# Patient Record
Sex: Male | Born: 1980 | Race: White | Hispanic: No | Marital: Single | Smoking: Never smoker
Health system: Southern US, Community
[De-identification: ages and names within clinical notes are randomized; demographics above are authoritative.]

## PROBLEM LIST (undated history)

## (undated) DIAGNOSIS — R51 Headache: Principal | ICD-10-CM

## (undated) DIAGNOSIS — F329 Major depressive disorder, single episode, unspecified: Secondary | ICD-10-CM

## (undated) DIAGNOSIS — T7840XA Allergy, unspecified, initial encounter: Secondary | ICD-10-CM

## (undated) DIAGNOSIS — IMO0001 Reserved for inherently not codable concepts without codable children: Secondary | ICD-10-CM

## (undated) DIAGNOSIS — F32A Depression, unspecified: Secondary | ICD-10-CM

## (undated) DIAGNOSIS — G479 Sleep disorder, unspecified: Secondary | ICD-10-CM

## (undated) DIAGNOSIS — F419 Anxiety disorder, unspecified: Secondary | ICD-10-CM

## (undated) DIAGNOSIS — G43909 Migraine, unspecified, not intractable, without status migrainosus: Secondary | ICD-10-CM

## (undated) DIAGNOSIS — K219 Gastro-esophageal reflux disease without esophagitis: Secondary | ICD-10-CM

## (undated) HISTORY — DX: Major depressive disorder, single episode, unspecified: F32.9

## (undated) HISTORY — DX: Sleep disorder, unspecified: G47.9

## (undated) HISTORY — DX: Depression, unspecified: F32.A

## (undated) HISTORY — DX: Migraine, unspecified, not intractable, without status migrainosus: G43.909

## (undated) HISTORY — DX: Gastro-esophageal reflux disease without esophagitis: K21.9

## (undated) HISTORY — DX: Anxiety disorder, unspecified: F41.9

## (undated) HISTORY — DX: Reserved for inherently not codable concepts without codable children: IMO0001

## (undated) HISTORY — DX: Headache: R51

## (undated) HISTORY — DX: Allergy, unspecified, initial encounter: T78.40XA

---
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
## 2013-09-03 ENCOUNTER — Ambulatory Visit: Payer: Self-pay | Admitting: Family Medicine

## 2013-09-03 VITALS — BP 132/78 | HR 59 | Temp 98.7°F | Resp 18 | Ht 73.25 in | Wt 306.4 lb

## 2013-09-03 DIAGNOSIS — R51 Headache: Secondary | ICD-10-CM

## 2013-09-03 DIAGNOSIS — G988 Other disorders of nervous system: Secondary | ICD-10-CM

## 2013-09-03 DIAGNOSIS — R5381 Other malaise: Secondary | ICD-10-CM

## 2013-09-03 DIAGNOSIS — G479 Sleep disorder, unspecified: Secondary | ICD-10-CM

## 2013-09-03 DIAGNOSIS — R5383 Other fatigue: Secondary | ICD-10-CM

## 2013-09-03 LAB — POCT CBC
Granulocyte percent: 61.5 %G (ref 37–80)
HCT, POC: 48.5 % (ref 43.5–53.7)
HEMOGLOBIN: 15.9 g/dL (ref 14.1–18.1)
Lymph, poc: 2.4 (ref 0.6–3.4)
MCH, POC: 30.9 pg (ref 27–31.2)
MCHC: 32.8 g/dL (ref 31.8–35.4)
MCV: 94.1 fL (ref 80–97)
MID (cbc): 0.5 (ref 0–0.9)
MPV: 8.8 fL (ref 0–99.8)
POC GRANULOCYTE: 4.6 (ref 2–6.9)
POC LYMPH PERCENT: 32.1 %L (ref 10–50)
POC MID %: 6.4 % (ref 0–12)
Platelet Count, POC: 198 10*3/uL (ref 142–424)
RBC: 5.15 M/uL (ref 4.69–6.13)
RDW, POC: 13.7 %
WBC: 7.4 10*3/uL (ref 4.6–10.2)

## 2013-09-03 LAB — POCT SEDIMENTATION RATE: POCT SED RATE: 15 mm/hr (ref 0–22)

## 2013-09-03 LAB — GLUCOSE, POCT (MANUAL RESULT ENTRY): POC GLUCOSE: 71 mg/dL (ref 70–99)
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
## 2013-09-04 LAB — TSH: TSH: 2.082 u[IU]/mL (ref 0.350–4.500)

## 2013-10-01 ENCOUNTER — Ambulatory Visit (INDEPENDENT_AMBULATORY_CARE_PROVIDER_SITE_OTHER): Payer: PRIVATE HEALTH INSURANCE | Admitting: Neurology

## 2013-10-01 ENCOUNTER — Encounter: Payer: Self-pay | Admitting: Neurology

## 2013-10-01 ENCOUNTER — Ambulatory Visit: Payer: Self-pay | Admitting: Neurology

## 2013-10-01 VITALS — BP 144/86 | HR 66 | Temp 99.1°F | Ht 73.0 in | Wt 315.0 lb

## 2013-10-01 DIAGNOSIS — R400 Somnolence: Secondary | ICD-10-CM

## 2013-10-01 DIAGNOSIS — G479 Sleep disorder, unspecified: Secondary | ICD-10-CM

## 2013-10-01 DIAGNOSIS — R51 Headache: Secondary | ICD-10-CM

## 2013-10-01 DIAGNOSIS — R519 Headache, unspecified: Secondary | ICD-10-CM | POA: Insufficient documentation

## 2013-10-01 DIAGNOSIS — R0683 Snoring: Secondary | ICD-10-CM

## 2013-10-01 DIAGNOSIS — R0989 Other specified symptoms and signs involving the circulatory and respiratory systems: Secondary | ICD-10-CM

## 2013-10-01 DIAGNOSIS — E669 Obesity, unspecified: Secondary | ICD-10-CM

## 2013-10-01 DIAGNOSIS — G4710 Hypersomnia, unspecified: Secondary | ICD-10-CM

## 2013-10-01 DIAGNOSIS — G2581 Restless legs syndrome: Secondary | ICD-10-CM

## 2013-10-01 DIAGNOSIS — R0609 Other forms of dyspnea: Secondary | ICD-10-CM

## 2013-10-01 DIAGNOSIS — G4761 Periodic limb movement disorder: Secondary | ICD-10-CM

## 2013-10-01 HISTORY — DX: Headache, unspecified: R51.9

## 2013-10-01 HISTORY — DX: Sleep disorder, unspecified: G47.9

## 2013-10-01 NOTE — Patient Instructions (Addendum)
Please remember, common headache triggers are: sleep deprivation, dehydration, overheating, stress, hypoglycemia or skipping meals and blood sugar fluctuations, excessive pain medications or excessive alcohol use or caffeine withdrawal. Some people have food triggers such as aged cheese, orange juice or chocolate, especially dark chocolate, or MSG (monosodium glutamate). Try to avoid these headache triggers as much possible. It may be helpful to keep a headache diary to figure out what makes your headaches worse or brings them on and what alleviates them. Some people report headache onset after exercise but studies have shown that regular exercise may actually prevent headaches from coming. If you have exercise-induced headaches, please make sure that you drink plenty of fluid before and after exercising and that you do not over do it and do not overheat.  Please remember to try to maintain good sleep hygiene, which means: Keep a regular sleep and wake schedule, try not to exercise or have a meal within 2 hours of your bedtime, try to keep your bedroom conducive for sleep, that is, cool and dark, without light distractors such as an illuminated alarm clock, and refrain from watching TV right before sleep or in the middle of the night and do not keep the TV or radio on during the night. Also, try not to use or play on electronic devices at bedtime, such as your cell phone, tablet PC or laptop. If you like to read at bedtime on an electronic device, try to dim the background light as much as possible. Do not eat in the middle of the night.   I would like to do a sleep study and brain MRI for completion.

## 2013-10-01 NOTE — Progress Notes (Signed)
Subjective:    Patient ID: Cody Long is a 33 y.o. male.  HPI    Cody Foley, MD, PhD [facility] [address] [address] [place], [place] [address]  Dear Dr. Clelia Croft,   I saw your patient, Cody Long, upon your kind request in my neurologic clinic today for initial consultation of his recurrent headaches. The patient is unaccompanied today. As you know, Mr. [name] is a 33 year old right-handed gentleman with an underlying medical history of allergies, anxiety, depression and obesity, who reports, short electric like impulses inside his head, generalized, lasting seconds at a time and intermittent with no pattern, for years. He attributed these to being off of Cymbalta at the time. He is no longer on it. Sometimes this electric pulse goes to the arms, rarely to the legs, but not classic Lhermitte's, as the sensation is not related to head movement, especially bending, but he does have an occasional trigger with head turn. Symptoms seem to get worse with stress, dehydration, sleep deprived. He sleeps poorly and has occasional morning HAs. He has been told that he snores and is unclear if he has apneas. He has some RLS symptoms and twitches in his sleep. He has always had "bad dreams". His older brother, 58, has HTN, and OSA and is on CPAP, his younger brother, 59, has bipolar.  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 He does not nap. He is in bed by 11 PM to MN, and has some trouble falling asleep. He has tried OTC sleep aids, but felt groggy. He has nocturia x 2. He has had tinnitus for years. Never had TIA or stroke symptoms, denying sudden onset of one sided weakness, numbness, tingling, slurring of speech or droopy face, hearing loss, tinnitus, diplopia or visual field cut or monocular loss of vision, and denies recurrent migrainous headaches. Occasionally, he will see a quick flashing of light.  No history of seizure or staring spells.  His Epworth sleepiness score is 18 today. He never wakes up rested. He has to get up around 10.  His Past Medical History Is Significant For: Past Medical History  Diagnosis Date  . Allergy   . Anxiety   . Depression   . Migraine   . Reflux   . Recurrent headache 10/01/2013  . Sleep disturbance 10/01/2013    His Past Surgical History Is Significant For: Past Surgical History  Procedure Laterality Date  . Tonsillectomy and adenoidectomy  1988    His Family History Is Significant For: Family History  Problem Relation Age of Onset  . Diabetes Paternal Uncle   . Stroke Maternal Grandmother   . Hypertension Sister   . Hypertension Brother   . Bipolar disorder Brother   . Depression      Maternal family hx    His Social History Is Significant For: History   Social History  . Marital Status: Single    Spouse Name: N/A    Number of Children: N/A  . Years of Education: N/A   Social History Main Topics  . Smoking status: Never Smoker   . Smokeless tobacco: None  . Alcohol Use: No  . Drug Use: No  . Sexual Activity: None   Other Topics Concern  . None   Social History Narrative  . None    His Allergies Are:  No Known Allergies:   His Current Medications Are:  Outpatient Encounter Prescriptions as of 10/01/2013  Medication Sig  . DULoxetine (  CYMBALTA) 30 MG capsule Take 30 mg by mouth 2 (two) times daily.  Marland Kitchen. esomeprazole (NEXIUM) 20 MG capsule Take 20 mg by mouth daily at 12 noon.  . loratadine (CLARITIN) 10 MG tablet Take 10 mg by mouth daily.  . naproxen (NAPROSYN) 500 MG tablet Take 500 mg by mouth 2 (two) times daily with a meal.  :  Review of Systems:  Out of a complete 14 point review of systems, all are reviewed and negative with the exception of these symptoms as listed below:   Review of Systems  Constitutional: Positive for activity change and fatigue.  HENT: Positive for rhinorrhea and tinnitus.   Eyes: Positive for visual disturbance (blurred  vision).  Respiratory: Negative.   Cardiovascular: Positive for palpitations.  Gastrointestinal: Negative.   Endocrine:       Flushing  Genitourinary: Negative.   Musculoskeletal: Positive for myalgias.  Skin: Negative.   Allergic/Immunologic: Positive for environmental allergies.  Neurological: Positive for dizziness, speech difficulty, weakness, numbness and headaches.       Memory loss  Hematological: Negative.   Psychiatric/Behavioral: Positive for hallucinations, confusion, sleep disturbance (insomnia, e.d.s., restless leg), dysphoric mood and decreased concentration. The patient is nervous/anxious.     Objective:  Neurologic Exam  Physical Exam Physical Examination:   Filed Vitals:   10/01/13 1358  BP: 144/86  Pulse: 66  Temp: 99.1 F (37.3 C)    General Examination: The patient is a very pleasant 33 y.o. male in no acute distress. He appears well-developed and well-nourished and adequately groomed.   HEENT: Normocephalic, atraumatic, pupils are equal, round and reactive to light and accommodation. Funduscopic exam is normal with sharp disc margins noted. Extraocular tracking is good without limitation to gaze excursion or nystagmus noted. Normal smooth pursuit is noted. Hearing is grossly intact. Tympanic membranes are clear bilaterally. Face is symmetric with normal facial animation and normal facial sensation. Speech is clear with no dysarthria noted. There is no hypophonia. There is no lip, neck/head, jaw or voice tremor. Neck is supple with full range of passive and active motion. There are no carotid bruits on auscultation. Oropharynx exam reveals: mild mouth dryness, adequate dental hygiene and mild to moderate airway crowding, due to narrow airway entry and thicker and wider based tongue. Mallampati is class II. Tongue protrudes centrally and palate elevates symmetrically. Tonsils are absent. Neck size is 17 inches. He has a tiny overbite. Nasal inspection reveals no  significant nasal mucosal bogginess or redness and no septal deviation.   Chest: Clear to auscultation without wheezing, rhonchi or crackles noted.  Heart: S1+S2+0, regular and normal without murmurs, rubs or gallops noted.   Abdomen: Soft, non-tender and non-distended with normal bowel sounds appreciated on auscultation.  Extremities: There is no pitting edema in the distal lower extremities bilaterally. Pedal pulses are intact.  Skin: Warm and dry without trophic changes noted. There are no varicose veins.  Musculoskeletal: exam reveals no obvious joint deformities, tenderness or joint swelling or erythema.   Neurologically:  Mental status: The patient is awake, alert and oriented in all 4 spheres. His immediate and remote memory, attention, language skills and fund of knowledge are appropriate. There is no evidence of aphasia, agnosia, apraxia or anomia. Speech is clear with normal prosody and enunciation. Thought process is linear. Mood is normal and affect is normal.  Cranial nerves II - XII are as described above under HEENT exam. In addition: shoulder shrug is normal with equal shoulder height noted. Motor exam: Normal bulk,  strength and tone is noted. There is no drift, tremor or rebound. Romberg is negative. Reflexes are 2+ throughout. Babinski: Toes are flexor bilaterally. Fine motor skills and coordination: intact with normal finger taps, normal hand movements, normal rapid alternating patting, normal foot taps and normal foot agility.  Cerebellar testing: No dysmetria or intention tremor on finger to nose testing. Heel to shin is unremarkable bilaterally. There is no truncal or gait ataxia.  Sensory exam: intact to light touch, pinprick, vibration, temperature sense in the upper and lower extremities.  Gait, station and balance: He stands easily. No veering to one side is noted. No leaning to one side is noted. Posture is age-appropriate and stance is narrow based. Gait shows normal  stride length and normal pace. No problems turning are noted. He turns en bloc. Tandem walk is unremarkable. Intact toe and heel stance is noted.               Assessment and Plan:   In summary, Jefm PettyCasey Rencher is a very pleasant 33 y.o.-year old male with an underlying medical history of allergies, anxiety, depression and obesity, who reports short electric like impulses inside his head, lasting seconds at a time and intermittent with no pattern. This has been ongoing for years. He also has a long-standing history of ringing in his ears. He has a long-standing history of poor sleep and his for years. He is history of recurrent abnormal sensations in his head is not in keeping with a seizure, migraine, or tension-type headache. This is not in keeping with cluster headaches. I'm actually not quite sure what's causing these sensations. Sometimes medication withdrawal can cause abnormal sensations and paresthesias. He did notice these in the context of stopping the Cymbalta. In that case things should improve with time. He also has a physical exam and history concerning for underlying sleep apnea. I would like to proceed with a sleep study and also a brain MRI. He has a nonfocal neurological exam and I reassured them in that regard. He endorses restless leg symptoms and twitching in his sleep. His brother has OSA. I would like to see him back after the sleep study and his MRI brain this time. I do not suggest any new medications at this time. We talked about common headache triggers. We also talked about sleep hygiene. I answered all his questions today and the patient was in agreement with the above outlined plan.  Thank you very much for allowing me to participate in the care of this nice patient. If I can be of any further assistance to you please do not hesitate to call me at [phone].  Sincerely,   Cody FoleySaima Athar, MD, PhD

## 2013-10-24 ENCOUNTER — Ambulatory Visit
Admission: RE | Admit: 2013-10-24 | Discharge: 2013-10-24 | Disposition: A | Discharge status: Home / Self Care | Payer: PRIVATE HEALTH INSURANCE | Source: Ambulatory Visit | Attending: Neurology | Admitting: Neurology

## 2013-10-24 DIAGNOSIS — G479 Sleep disorder, unspecified: Secondary | ICD-10-CM

## 2013-10-24 DIAGNOSIS — R400 Somnolence: Secondary | ICD-10-CM

## 2013-10-24 DIAGNOSIS — G4761 Periodic limb movement disorder: Secondary | ICD-10-CM

## 2013-10-24 DIAGNOSIS — R519 Headache, unspecified: Secondary | ICD-10-CM

## 2013-10-24 DIAGNOSIS — R0683 Snoring: Secondary | ICD-10-CM

## 2013-10-24 DIAGNOSIS — R51 Headache: Principal | ICD-10-CM

## 2013-10-24 DIAGNOSIS — E669 Obesity, unspecified: Secondary | ICD-10-CM

## 2013-10-24 DIAGNOSIS — G2581 Restless legs syndrome: Secondary | ICD-10-CM

## 2013-12-11 NOTE — Progress Notes (Signed)
Quick Note:  Please call patient and advise him that his recent brain MRI was slightly abnormal in that there was mild volume loss which recall atrophy which seems to be a little more than what we would expect for his age. We all have age appropriate atrophy or volume loss as we get older. There was nothing focal or specific and no evidence of a prior stroke or injury. We may just want to watch this down the Road by repeating a brain MRI in the future. There was also evidence of mild chronic sinus disease. No evidence of acute sinusitis, however, I had also suggested that he have a sleep study. Please inquire why we have not been able to do this yet.  Huston Foley, MD, PhD [facility] ([facility]  ______

## 2014-01-01 NOTE — Progress Notes (Signed)
Quick Note:  Shared results with patient and he verbalized understanding, has not had the sleep study due to finances,will try to have it done later ______

## 2014-01-05 ENCOUNTER — Telehealth: Payer: Self-pay | Admitting: Neurology

## 2014-01-05 NOTE — Telephone Encounter (Signed)
Patient needing clarification regarding MRI results.  Didn't understand the results fully.  Please call in afternoon and may leave message if not available.

## 2014-01-05 NOTE — Telephone Encounter (Signed)
Spoke with patient and he is requesting a face to face appt for better understanding of his MRI results, scheduled /confirmed appt with patient (01/08/14)

## 2014-01-08 ENCOUNTER — Telehealth: Payer: Self-pay | Admitting: *Deleted

## 2014-01-08 ENCOUNTER — Ambulatory Visit: Payer: Self-pay | Admitting: Neurology

## 2014-01-08 NOTE — Telephone Encounter (Signed)
Patient is no show for today's appt.(01/08/14)

## 2020-04-06 ENCOUNTER — Other Ambulatory Visit: Payer: Self-pay

## 2020-04-06 ENCOUNTER — Encounter (HOSPITAL_COMMUNITY): Payer: Self-pay

## 2020-04-06 ENCOUNTER — Emergency Department (HOSPITAL_COMMUNITY)
Admission: EM | Admit: 2020-04-06 | Discharge: 2020-04-06 | Disposition: A | Discharge status: Home / Self Care | Payer: BC Managed Care – PPO | Attending: Emergency Medicine | Admitting: Emergency Medicine

## 2020-04-06 ENCOUNTER — Emergency Department (HOSPITAL_COMMUNITY): Payer: BC Managed Care – PPO

## 2020-04-06 DIAGNOSIS — W01198A Fall on same level from slipping, tripping and stumbling with subsequent striking against other object, initial encounter: Secondary | ICD-10-CM | POA: Diagnosis not present

## 2020-04-06 DIAGNOSIS — S0083XA Contusion of other part of head, initial encounter: Secondary | ICD-10-CM | POA: Diagnosis not present

## 2020-04-06 DIAGNOSIS — H538 Other visual disturbances: Secondary | ICD-10-CM | POA: Diagnosis present

## 2020-04-06 LAB — CBC WITH DIFFERENTIAL/PLATELET
Abs Immature Granulocytes: 0.02 10*3/uL (ref 0.00–0.07)
Basophils Absolute: 0 10*3/uL (ref 0.0–0.1)
Basophils Relative: 1 %
Eosinophils Absolute: 0.2 10*3/uL (ref 0.0–0.5)
Eosinophils Relative: 4 %
HCT: 43.6 % (ref 39.0–52.0)
Hemoglobin: 14.6 g/dL (ref 13.0–17.0)
Immature Granulocytes: 0 %
Lymphocytes Relative: 31 %
Lymphs Abs: 2 10*3/uL (ref 0.7–4.0)
MCH: 31 pg (ref 26.0–34.0)
MCHC: 33.5 g/dL (ref 30.0–36.0)
MCV: 92.6 fL (ref 80.0–100.0)
Monocytes Absolute: 0.4 10*3/uL (ref 0.1–1.0)
Monocytes Relative: 6 %
Neutro Abs: 3.7 10*3/uL (ref 1.7–7.7)
Neutrophils Relative %: 58 %
Platelets: 134 10*3/uL — ABNORMAL LOW (ref 150–400)
RBC: 4.71 MIL/uL (ref 4.22–5.81)
RDW: 12.3 % (ref 11.5–15.5)
WBC: 6.4 10*3/uL (ref 4.0–10.5)
nRBC: 0 % (ref 0.0–0.2)

## 2020-04-06 LAB — COMPREHENSIVE METABOLIC PANEL
ALT: 21 U/L (ref 0–44)
AST: 20 U/L (ref 15–41)
Albumin: 4.7 g/dL (ref 3.5–5.0)
Alkaline Phosphatase: 101 U/L (ref 38–126)
Anion gap: 9 (ref 5–15)
BUN: 13 mg/dL (ref 6–20)
CO2: 26 mmol/L (ref 22–32)
Calcium: 9.2 mg/dL (ref 8.9–10.3)
Chloride: 105 mmol/L (ref 98–111)
Creatinine, Ser: 1.01 mg/dL (ref 0.61–1.24)
GFR, Estimated: >60 mL/min (ref >60)
Glucose, Bld: 88 mg/dL (ref 70–99)
Potassium: 4 mmol/L (ref 3.5–5.1)
Sodium: 140 mmol/L (ref 135–145)
Total Bilirubin: 2 mg/dL — ABNORMAL HIGH (ref 0.3–1.2)
Total Protein: 7.7 g/dL (ref 6.5–8.1)

## 2020-04-06 NOTE — ED Provider Notes (Signed)
[facility]-EMERGENCY DEPT Provider Note   CSN: [id number] Arrival date & time: 04/06/20  1610     History Chief Complaint  Patient presents with  . Blurred Vision    Cody Long is a 39 y.o. male presenting to the emergency department with complaint of blurred vision to the right eye that began yesterday.  Patient states on Sunday he tripped and fell and hit his right forehead on some wood on the ground.  He did not lose consciousness.  He is having some mild pain to his right forehead.  No significant headache, nausea, vomiting, neck pain, or other injuries.  He states however yesterday began having blurry vision to his right eye.  He describes a warped/wavy looking vision to the lower aspect of his field-of-view, also some changes in "light refraction."  No double vision or loss of vision in a particular region. He wears glasses.  Not on anticoagulation.  The history is provided by the patient.       Past Medical History:  Diagnosis Date  . Allergy   . Anxiety   . Depression   . Migraine   . Recurrent headache 10/01/2013  . Reflux   . Sleep disturbance 10/01/2013    Patient Active Problem List   Diagnosis Date Noted  . Recurrent headache 10/01/2013  . Sleep disturbance 10/01/2013    Past Surgical History:  Procedure Laterality Date  . TONSILLECTOMY AND ADENOIDECTOMY  1988       Family History  Problem Relation Age of Onset  . Diabetes Paternal Uncle   . Stroke Maternal Grandmother   . Hypertension Sister   . Hypertension Brother   . Bipolar disorder Brother   . Depression Other        Maternal family hx    Social History   Tobacco Use  . Smoking status: Never Smoker  Substance Use Topics  . Alcohol use: No  . Drug use: No    Home Medications Prior to Admission medications   Medication Sig Start Date End Date Taking? Authorizing Provider  DULoxetine (CYMBALTA) 30 MG capsule Take 30 mg by mouth 2 (two) times daily.    [provider]  esomeprazole (NEXIUM) 20 MG capsule Take 20 mg by mouth daily at 12 noon.    [provider]  loratadine (CLARITIN) 10 MG tablet Take 10 mg by mouth daily.    [provider]  naproxen (NAPROSYN) 500 MG tablet Take 500 mg by mouth 2 (two) times daily with a meal.    [provider]    Allergies    Patient has no known allergies.  Review of Systems   Review of Systems  Eyes: Positive for visual disturbance. Negative for photophobia and pain.  All other systems reviewed and are negative.   Physical Exam Updated Vital Signs BP (!) 155/81   Pulse (!) 40   Temp 98.2 F (36.8 C) (Oral)   Resp 12   Ht 6\' 1"  (1.854 m)   Wt 125.2 kg   SpO2 100%   BMI 36.41 kg/m   Physical Exam Vitals and nursing note reviewed.  Constitutional:      General: He is not in acute distress.    Appearance: He is well-developed and well-nourished. He is not ill-appearing.  HENT:     Head: Normocephalic.     Comments: Slight bruising with hematoma noted to right forehead. No TTP or deformity to brow or cheek.  Eyes:     General:  Right eye: No discharge.        Left eye: No discharge.     Extraocular Movements: Extraocular movements intact.     Conjunctiva/sclera: Conjunctivae normal.     Pupils: Pupils are equal, round, and reactive to light.  Cardiovascular:     Rate and Rhythm: Normal rate and regular rhythm.  Pulmonary:     Effort: Pulmonary effort is normal.  Abdominal:     Palpations: Abdomen is soft.  Skin:    General: Skin is warm.  Neurological:     Mental Status: He is alert.  Psychiatric:        Mood and Affect: Mood and affect normal.        Behavior: Behavior normal.     ED Results / Procedures / Treatments   Labs (all labs ordered are listed, but only abnormal results are displayed) Labs Reviewed  COMPREHENSIVE METABOLIC PANEL - Abnormal; Notable for the following components:      Result Value   Total Bilirubin 2.0 (*)     All other components within normal limits  CBC WITH DIFFERENTIAL/PLATELET - Abnormal; Notable for the following components:   Platelets 134 (*)    All other components within normal limits    EKG None  Radiology CT Head Wo Contrast  Result Date: 04/06/2020 CLINICAL DATA:  Head trauma status post fall 04/03/2020. Blurred vision to right thigh starting yesterday. EXAM: CT HEAD WITHOUT CONTRAST TECHNIQUE: Contiguous axial images were obtained from the base of the skull through the vertex without intravenous contrast. COMPARISON:  MR head 10/24/2013 FINDINGS: Brain: No evidence of large-territorial acute infarction. No parenchymal hemorrhage. No mass lesion. No extra-axial collection. No mass effect or midline shift. No hydrocephalus. Basilar cisterns are patent. Vascular: No hyperdense vessel. Skull: No acute fracture or focal lesion. Sinuses/Orbits: Paranasal sinuses and mastoid air cells are clear. The orbits are unremarkable. Other: None. IMPRESSION: No acute intracranial abnormality. Electronically Signed   By: Tish Frederickson M.D.   On: 04/06/2020 18:19    Procedures Ultrasound ED Ocular  Date/Time: 04/06/2020 7:44 PM Performed by: Latroy Gaymon, [place] N, PA-C Authorized by: Dhara Schepp, [place] N, PA-C   PROCEDURE DETAILS:    Indications: visual change     Assessed:  Right eye   Right eye sagittal view: obtained     Images: archived   RIGHT EYE FINDINGS:     right eye lens not dislodged Comments:     No obvious retinal detachment, possible irregularity along posterior margin   (including critical care time)  Medications Ordered in ED Medications - No data to display  ED Course  I have reviewed the triage vital signs and the nursing notes.  Pertinent labs & imaging results that were available during my care of the patient were reviewed by me and considered in my medical decision making (see chart for details).  Clinical Course as of 04/06/20 2038  Wed Apr 06, 2020  1946  Ophthalmology page sent out by myself after unsuccessful attempt x2 by secretary [JR]    Clinical Course User Index [JR] Sobia Karger, [place] N, PA-C   MDM Rules/Calculators/A&P                          Patient presenting to the ED for blurry vision and "warped" vision that began yesterday. Patient had minor head injury on Sunday without LOC to right forehead, not anticoagulated.  On exam he is well-appearing and in no distress.  Focal neuro deficits.  He has hematoma to the right forehead.  PERRL, EOM intact without entrapment.  Visual acuity is 20/40 in the right eye, 20/20 in the left corrected.  Bedside ocular ultrasound was performed with attending physician at bedside, with concern for retinal detachment. U/S reveals no evidence of obvious retinal detachment.  CT head is negative. Doubt central cause. Laboratory work is unremarkable.  He is noted to be bradycardic here in sinus rhythm, however he states this is not new for him.  He is not having any lightheadedness.   Consulted with ophthalmologist, Dr. Sherrine Maples.  Dr. Sherrine Maples reports he is currently in his office and is willing to see the patient night for urgent evaluation.  Patient is to report directly to ophthalmologist office for evaluation.  Patient verbalized understanding and agrees with this care plan. Patient discharged in no distress.   Final Clinical Impression(s) / ED Diagnoses Final diagnoses:  Blurred vision, right eye    Rx / DC Orders ED Discharge Orders    None       Wanell Lorenzi, [place] N, PA-C 04/06/20 2046    Rozelle Logan, DO 04/06/20 2228

## 2020-04-06 NOTE — ED Notes (Signed)
Report received from Monique, RN.  Assumed role as primary nurse at this time.  Pt resting quietly in bed at this time.  NADN.  Will continue to monitor. 

## 2020-04-06 NOTE — Discharge Instructions (Addendum)
Please go directly to the eye doctor's office at the address listed. You will be seeing Dr. Sherrine Maples.  The entrance is around back, labeled [facility]

## 2020-04-06 NOTE — ED Triage Notes (Addendum)
Pt states he fell last Sunday 12/19 and hit his head. Slight bruise to right forehead. Pt c/o blurred vision to right eye starting yesterday. Pt denies taking blood thinners. Hand grips equal bilaterally. HR 43 in triage, palpated to verify. BP 200/114 Pt states he does not take meds for BP.

## 2022-05-26 IMAGING — CT CT HEAD W/O CM
3 series · 15 of 47 positions shown, 18 images · non-contrast
Comparison: MR head 10/24/2013

CLINICAL DATA: Head trauma status post fall 04/03/2020. Blurred
vision to right thigh starting yesterday.

EXAM:
CT HEAD WITHOUT CONTRAST
TECHNIQUE: Contiguous axial images were obtained from the base of the skull
through the vertex without intravenous contrast.

[Series 2: head wo · axial · 0.45mm/px · z∈[-123,+12]mm · 9 of 33 slices shown, 12 images]
[im 3/33  brain]
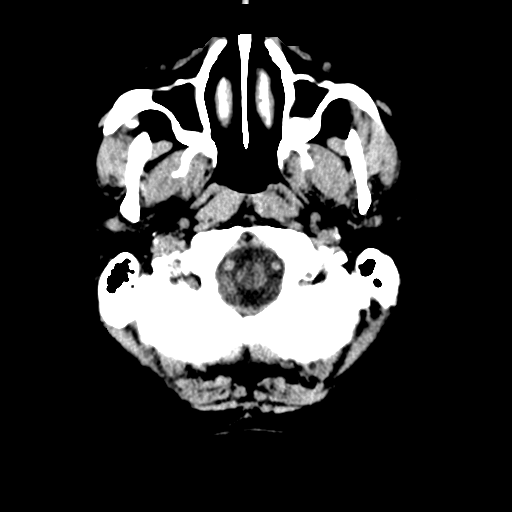
[im 3/33  bone]
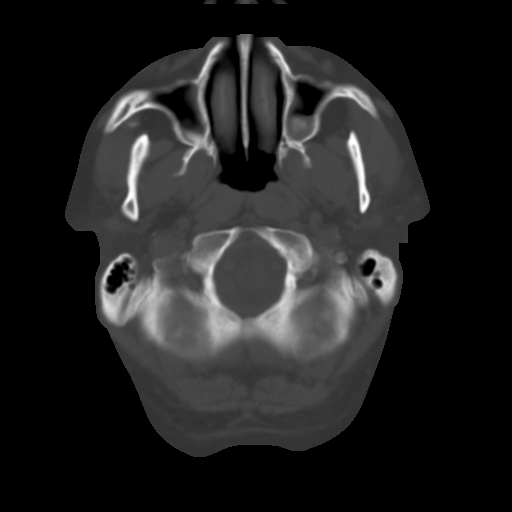
[im 6/33  brain]
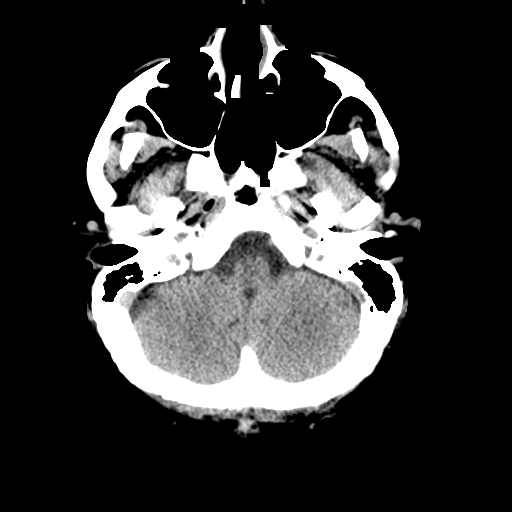
[im 9/33  brain]
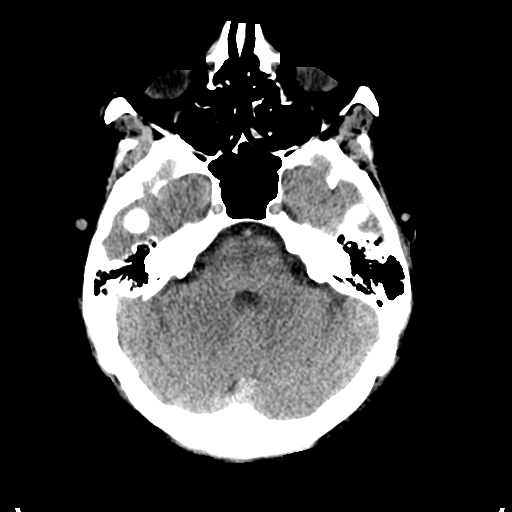
[im 13/33  brain]
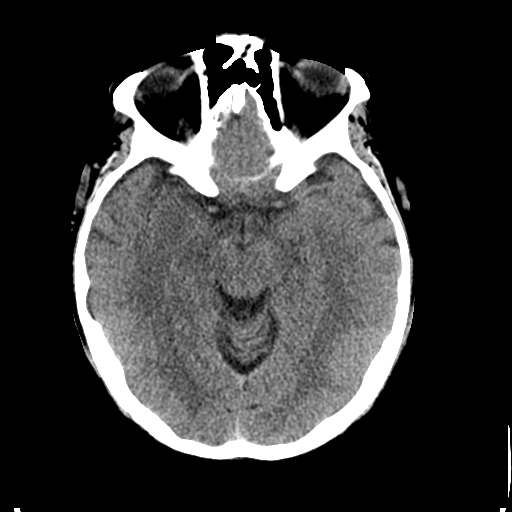
[im 17/33  brain]
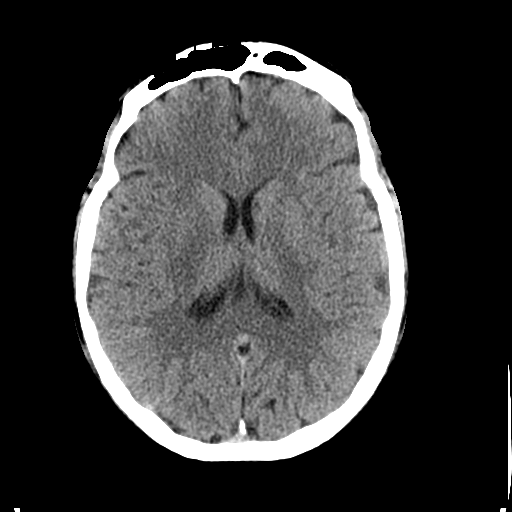
[im 17/33  bone]
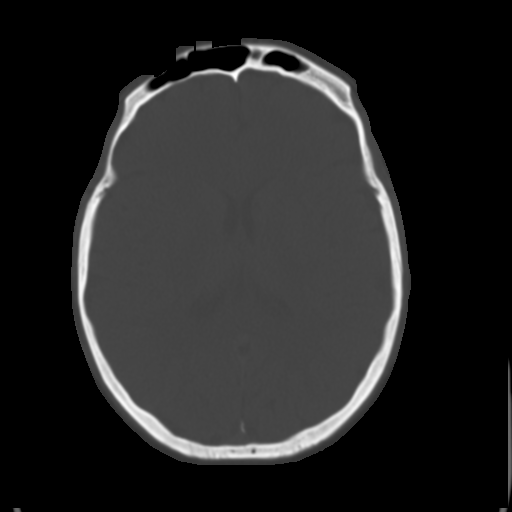
[im 20/33  brain]
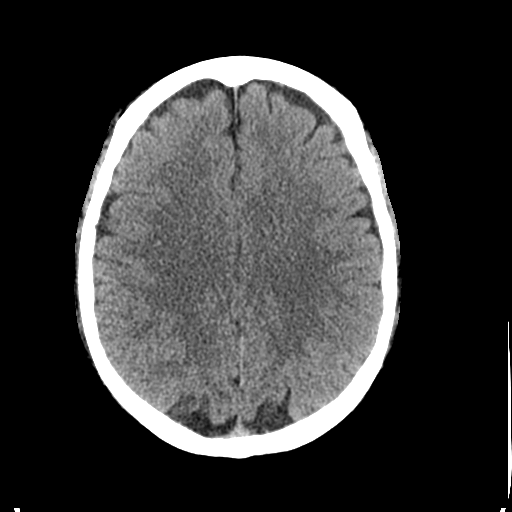
[im 24/33  brain]
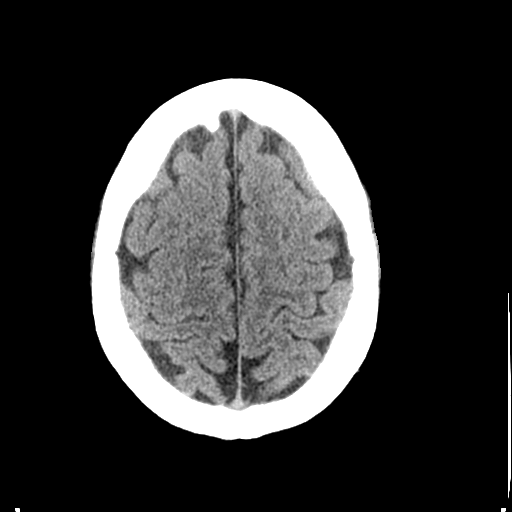
[im 27/33  brain]
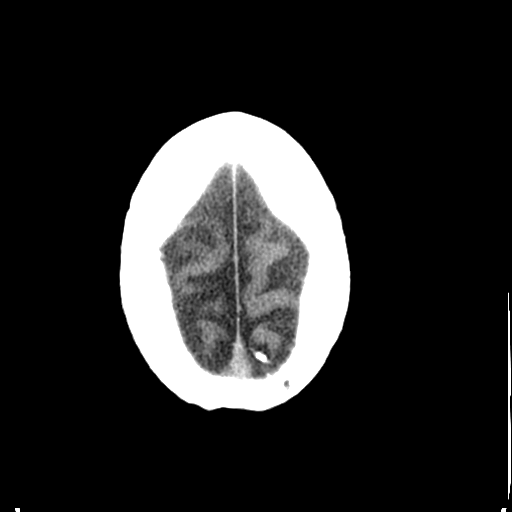
[im 30/33  brain]
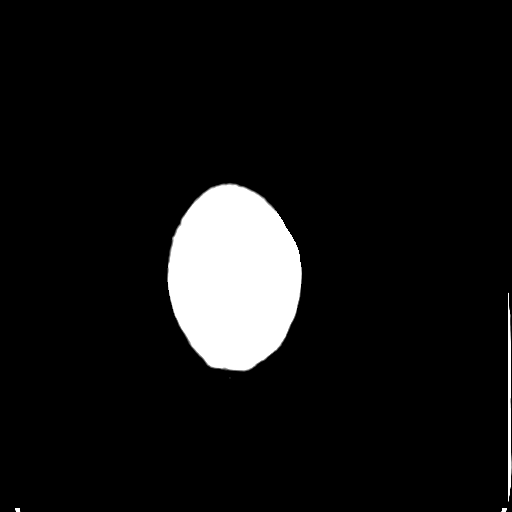
[im 30/33  bone]
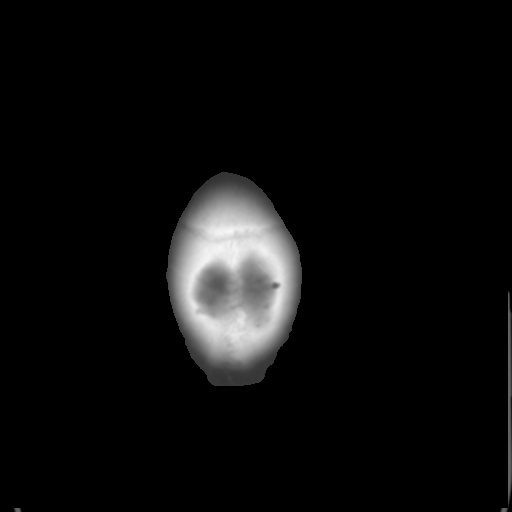

[Series 5: coronal soft tissue · coronal · 0.33mm/px · 3 of 73 slices shown]
[im 28/73  brain]
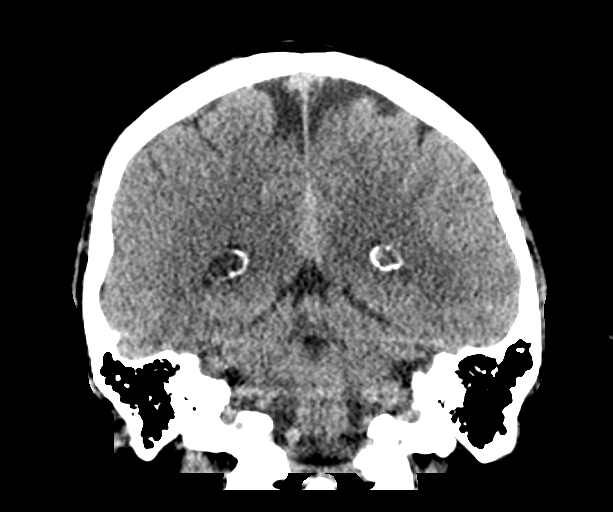
[im 34/73  brain]
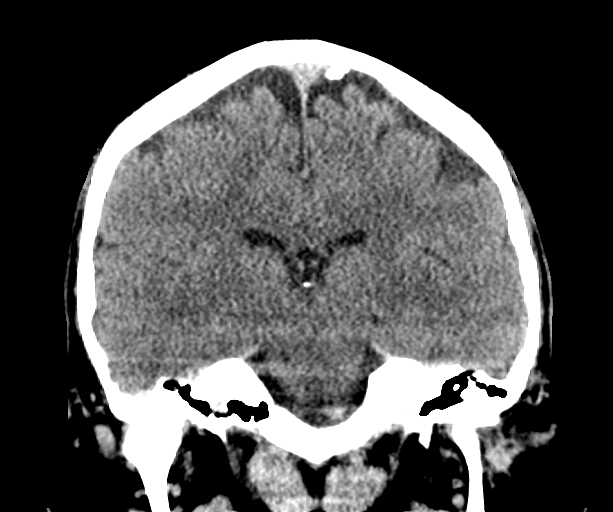
[im 39/73  brain]
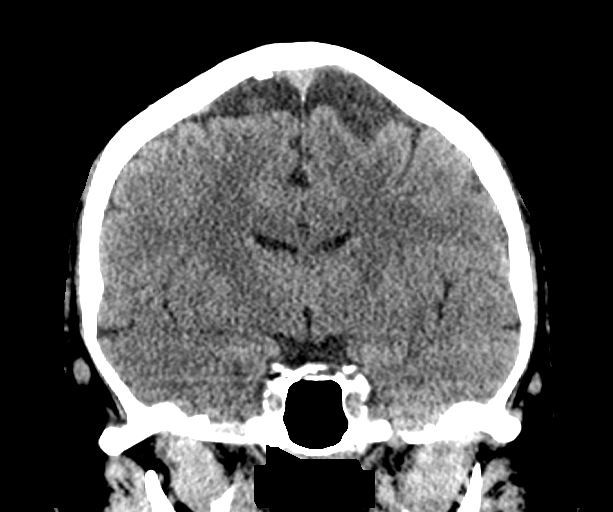

[Series 6: sagittal soft tissue · sagittal · 0.33mm/px · 3 of 67 slices shown]
[im 23/67  brain]
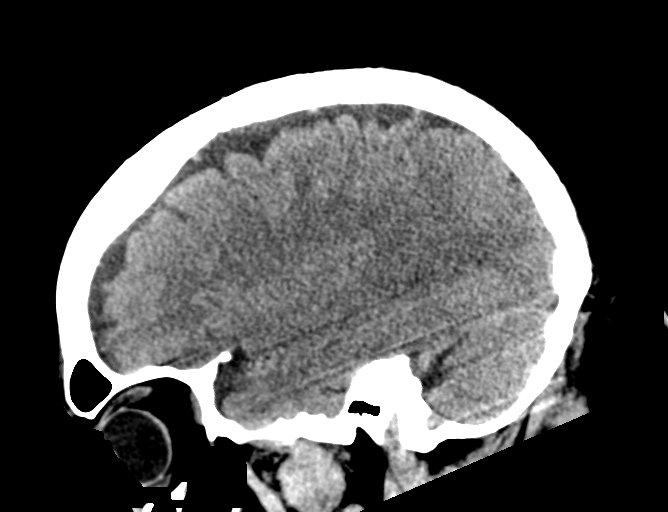
[im 34/67  brain]
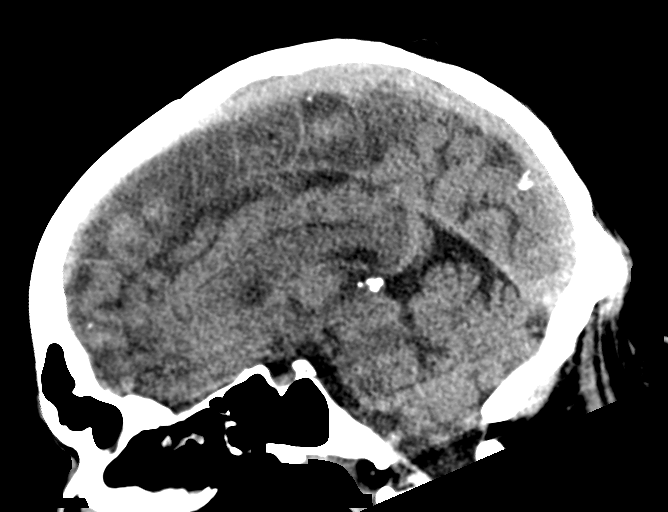
[im 45/67  brain]
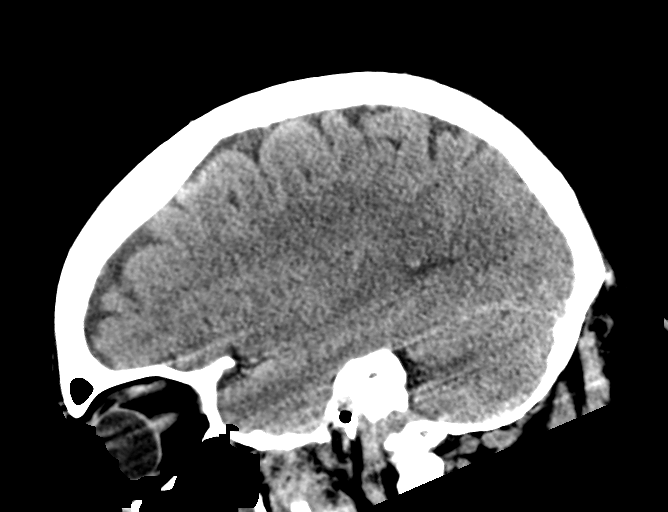

[15 of 47 positions shown; findings below may reference images not displayed]

FINDINGS: Brain:

No evidence of large-territorial acute infarction. No parenchymal
hemorrhage. No mass lesion. No extra-axial collection.

No mass effect or midline shift. No hydrocephalus. Basilar cisterns
are patent.

Vascular: No hyperdense vessel.

Skull: No acute fracture or focal lesion.

Sinuses/Orbits: Paranasal sinuses and mastoid air cells are clear.
The orbits are unremarkable.

Other: None.
IMPRESSION: No acute intracranial abnormality.
